# Patient Record
Sex: Female | Born: 2000 | Hispanic: Yes | Marital: Single | Smoking: Never smoker
Health system: Southern US, Community
[De-identification: ages and names within clinical notes are randomized; demographics above are authoritative.]

## PROBLEM LIST (undated history)

## (undated) DIAGNOSIS — D509 Iron deficiency anemia, unspecified: Secondary | ICD-10-CM

## (undated) DIAGNOSIS — L709 Acne, unspecified: Secondary | ICD-10-CM

## (undated) DIAGNOSIS — N920 Excessive and frequent menstruation with regular cycle: Secondary | ICD-10-CM

## (undated) HISTORY — DX: Excessive and frequent menstruation with regular cycle: N92.0

## (undated) HISTORY — PX: NO PAST SURGERIES: SHX2092

## (undated) HISTORY — DX: Iron deficiency anemia, unspecified: D50.9

## (undated) HISTORY — DX: Acne, unspecified: L70.9

---
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
## 2022-07-07 ENCOUNTER — Encounter: Payer: Self-pay | Admitting: Obstetrics and Gynecology

## 2022-08-04 NOTE — Progress Notes (Signed)
Pediatrics, [facility]   Chief Complaint  Patient presents with   Dysmenorrhea    X 3 years    HPI:      Ms. Valerie Meadows is a 22 y.o. No obstetric history on file. whose LMP was Patient's last menstrual period was 08/04/2022 (exact date)., presents today for NP eval of menometrorrhagia, ref by PCP.  Menses are monthly, lasting 3-4 days, mod to heavy flow, with 1/2 dollar sized clots, recent BTB with dysmen and headaches. Has menstrual headaches, as well as mod dysmen with periods, improved usually with tylenol and ibup but sometimes worse and pt has to miss activities. Menarche age 78; dysmen worse past few yrs. Hx of IDA 10/22 and pt taking Fe supp. States recently labs with PCP were WNL. Interested in [facility] for periods; no hx of HTN, DVTs, migraines.  Pt has been sexually active in past, not currently. Used condoms, no STD testing done.   Past Medical History:  Diagnosis Date   Acne    IDA (iron deficiency anemia)    Menorrhagia     Past Surgical History:  Procedure Laterality Date   NO PAST SURGERIES      History reviewed. No pertinent family history.  Social History   Socioeconomic History   Marital status: Single    Spouse name: Not on file   Number of children: Not on file   Years of education: Not on file   Highest education level: Not on file  Occupational History   Not on file  Tobacco Use   Smoking status: Never   Smokeless tobacco: Never  Vaping Use   Vaping Use: Never used  Substance and Sexual Activity   Alcohol use: Never   Drug use: Never   Sexual activity: Not Currently    Birth control/protection: None  Other Topics Concern   Not on file  Social History Narrative   ** Merged History Encounter **       Social Determinants of Health   Financial Resource Strain: Not on file  Food Insecurity: Not on file  Transportation Needs: Not on file  Physical Activity: Not on file  Stress: Not on file  Social Connections: Not on file   Intimate Partner Violence: Not on file    Outpatient Medications Prior to Visit  Medication Sig Dispense Refill   ferrous sulfate 325 (65 FE) MG tablet TAKE 1 TABLET BY MOUTH TWICE A DAY FOR 30 DAYS     Tretinoin Microsphere (RETIN-A MICRO PUMP) 0.06 % GEL QHS to face, wash off QAM 50 g 2   No facility-administered medications prior to visit.      ROS:  Review of Systems  Constitutional:  Negative for fever.  Gastrointestinal:  Negative for blood in stool, constipation, diarrhea, nausea and vomiting.  Genitourinary:  Positive for menstrual problem. Negative for dyspareunia, dysuria, flank pain, frequency, hematuria, urgency, vaginal bleeding, vaginal discharge and vaginal pain.  Musculoskeletal:  Negative for back pain.  Skin:  Negative for rash.   BREAST: No symptoms   OBJECTIVE:   Vitals:  BP 100/64   Ht 4\' 11"  (1.499 m)   Wt 165 lb (74.8 kg)   LMP 08/04/2022 (Exact Date)   BMI 33.33 kg/m   Physical Exam Vitals reviewed.  Constitutional:      Appearance: She is well-developed.  Pulmonary:     Effort: Pulmonary effort is normal.  Genitourinary:    General: Normal vulva.     Pubic Area: No rash.  Labia:        Right: No rash, tenderness or lesion.        Left: No rash, tenderness or lesion.      Vagina: Bleeding present. No vaginal discharge, erythema or tenderness.     Cervix: Normal.     Uterus: Normal. Not enlarged and not tender.      Adnexa: Right adnexa normal and left adnexa normal.       Right: No mass or tenderness.         Left: No mass or tenderness.    Musculoskeletal:        General: Normal range of motion.     Cervical back: Normal range of motion.  Skin:    General: Skin is warm and dry.  Neurological:     General: No focal deficit present.     Mental Status: She is alert and oriented to person, place, and time.  Psychiatric:        Mood and Affect: Mood normal.        Behavior: Behavior normal.        Thought Content: Thought  content normal.        Judgment: Judgment normal.    Assessment/Plan: Menometrorrhagia - Plan: Norethindrone Acetate-Ethinyl Estrad-FE (MICROGESTIN 24 FE) 1-20 MG-MCG(24) tablet; discussed mgmt with OCPs vs Rx NSAIDs. Pt wants to try OCPs. Start Sunday (condoms for 1 mo if going to be sexually active). F/u via phone/MyChart msg in 3 mos re: sx and OCPs/sooner prn.   Iron deficiency anemia due to chronic blood loss--pt taking Fe supp, normal now with PCP. Starting OCPs.  Encounter for initial prescription of contraceptive pills - Plan: Norethindrone Acetate-Ethinyl Estrad-FE (MICROGESTIN 24 FE) 1-20 MG-MCG(24) tablet; Rx eRxd.   Screening for STD (sexually transmitted disease) - Plan: Cervicovaginal ancillary only    Meds ordered this encounter  Medications   Norethindrone Acetate-Ethinyl Estrad-FE (MICROGESTIN 24 FE) 1-20 MG-MCG(24) tablet    Sig: Take 1 tablet by mouth daily.    Dispense:  84 tablet    Refill:  0    Order Specific Question:   Supervising Provider    Answer:   Hildred Laser [[id number]]      Return in about 1 year (around 08/05/2023) for annual.  Kimani Hovis B. Cianna Kasparian, PA-C 08/05/2022 11:22 AM

## 2022-08-05 ENCOUNTER — Encounter: Payer: Self-pay | Admitting: Obstetrics and Gynecology

## 2022-08-05 ENCOUNTER — Ambulatory Visit (INDEPENDENT_AMBULATORY_CARE_PROVIDER_SITE_OTHER): Payer: Medicaid Other | Admitting: Obstetrics and Gynecology

## 2022-08-05 ENCOUNTER — Other Ambulatory Visit (HOSPITAL_COMMUNITY)
Admission: RE | Admit: 2022-08-05 | Discharge: 2022-08-05 | Disposition: A | Discharge status: Home / Self Care | Payer: Medicaid Other | Source: Ambulatory Visit | Attending: Obstetrics and Gynecology | Admitting: Obstetrics and Gynecology

## 2022-08-05 VITALS — BP 100/64 | Ht 59.0 in | Wt 165.0 lb

## 2022-08-05 DIAGNOSIS — Z30011 Encounter for initial prescription of contraceptive pills: Secondary | ICD-10-CM | POA: Diagnosis not present

## 2022-08-05 DIAGNOSIS — Z113 Encounter for screening for infections with a predominantly sexual mode of transmission: Secondary | ICD-10-CM | POA: Insufficient documentation

## 2022-08-05 DIAGNOSIS — N921 Excessive and frequent menstruation with irregular cycle: Secondary | ICD-10-CM | POA: Diagnosis not present

## 2022-08-05 DIAGNOSIS — D500 Iron deficiency anemia secondary to blood loss (chronic): Secondary | ICD-10-CM | POA: Diagnosis not present

## 2022-08-05 MED ORDER — MICROGESTIN 24 FE 1-20 MG-MCG PO TABS: 1.0000 | ORAL_TABLET | Freq: Every day | ORAL | 0 refills | Status: AC

## 2022-08-05 NOTE — Patient Instructions (Signed)
I value your feedback and you entrusting us with your care. If you get a [facility] patient survey, I would appreciate you taking the time to let us know about your experience today. Thank you! ? ? ?

## 2022-08-10 LAB — CERVICOVAGINAL ANCILLARY ONLY
Chlamydia: NEGATIVE
Comment: NEGATIVE
Comment: NEGATIVE
Comment: NORMAL
Neisseria Gonorrhea: NEGATIVE
Trichomonas: NEGATIVE

## 2022-09-23 IMAGING — CR DG CHEST 2V
1 series · 2 of 2 positions shown · non-contrast
Comparison: None.

CLINICAL DATA: Fever and cough.

EXAM:
CHEST - 2 VIEW

[Series 1: dg chest 2 view · 0.14mm/px · 2 of 2 slices shown]
[im 1/2]
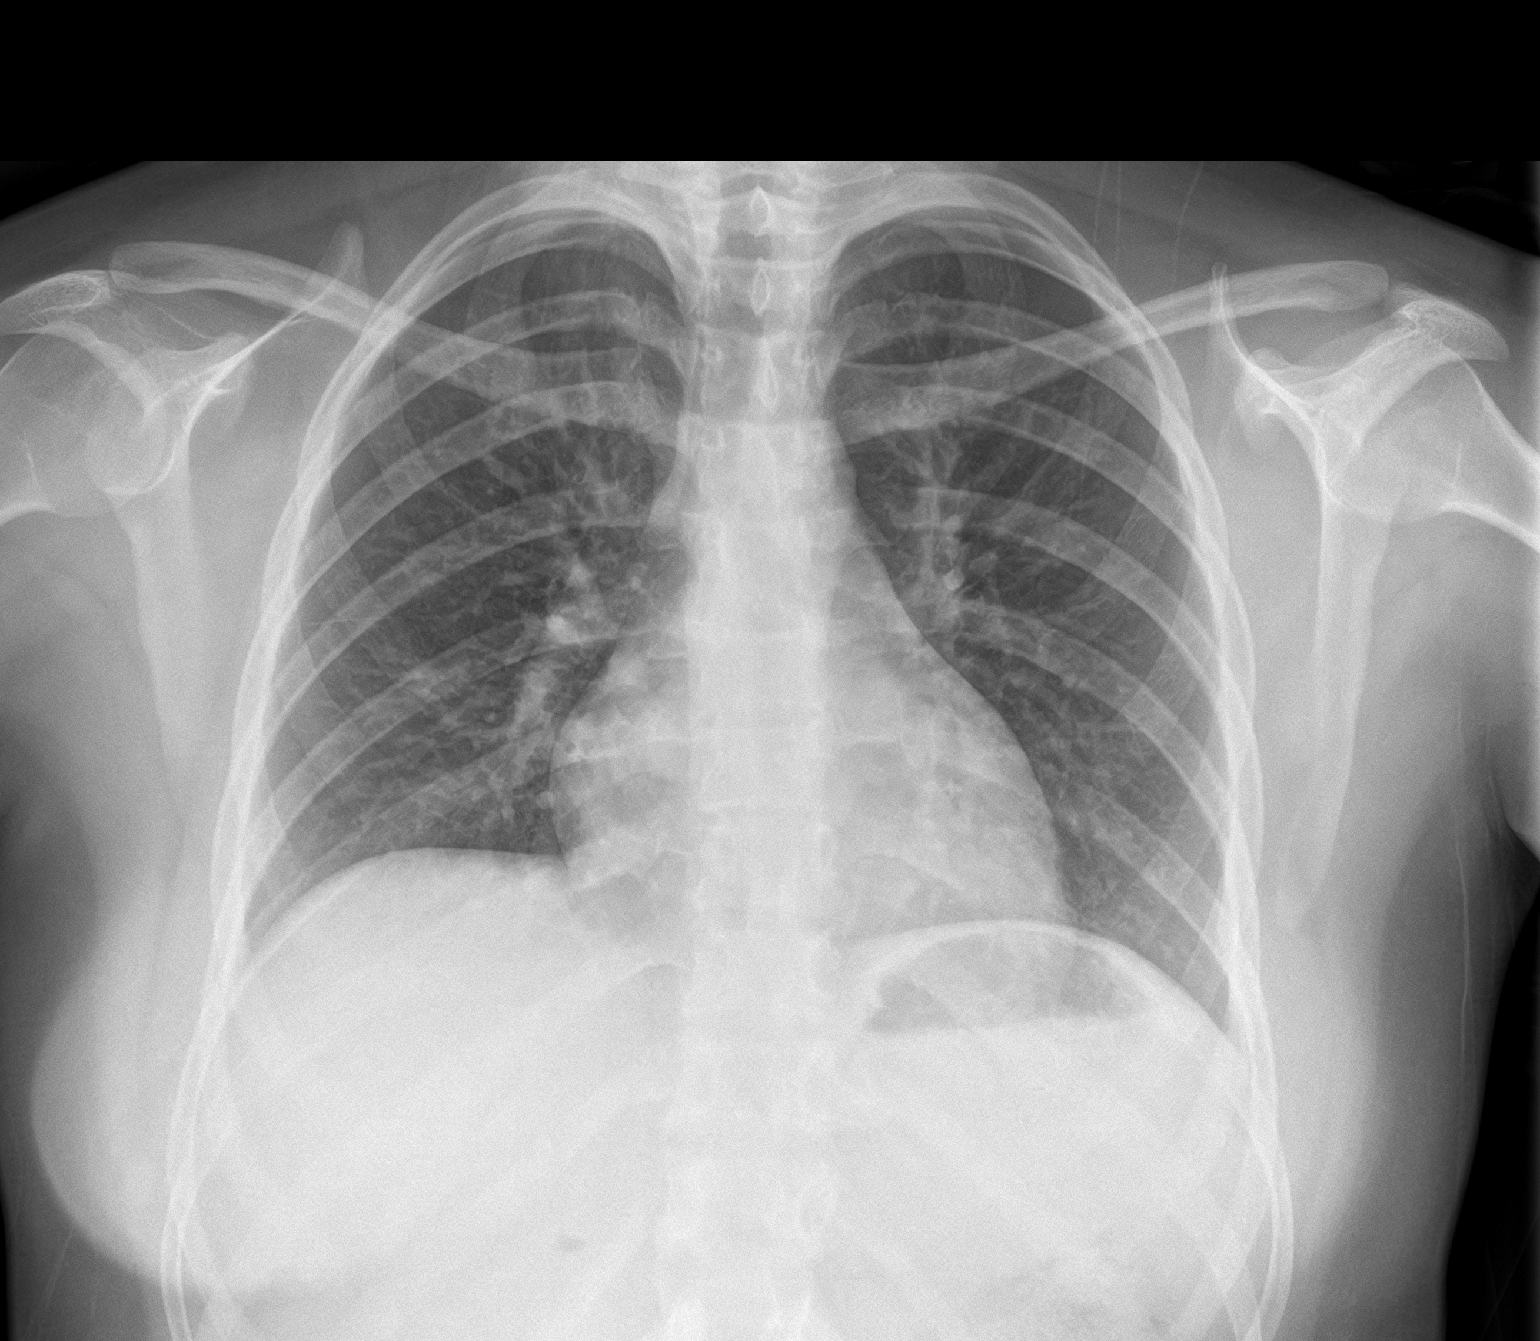
[im 2/2]
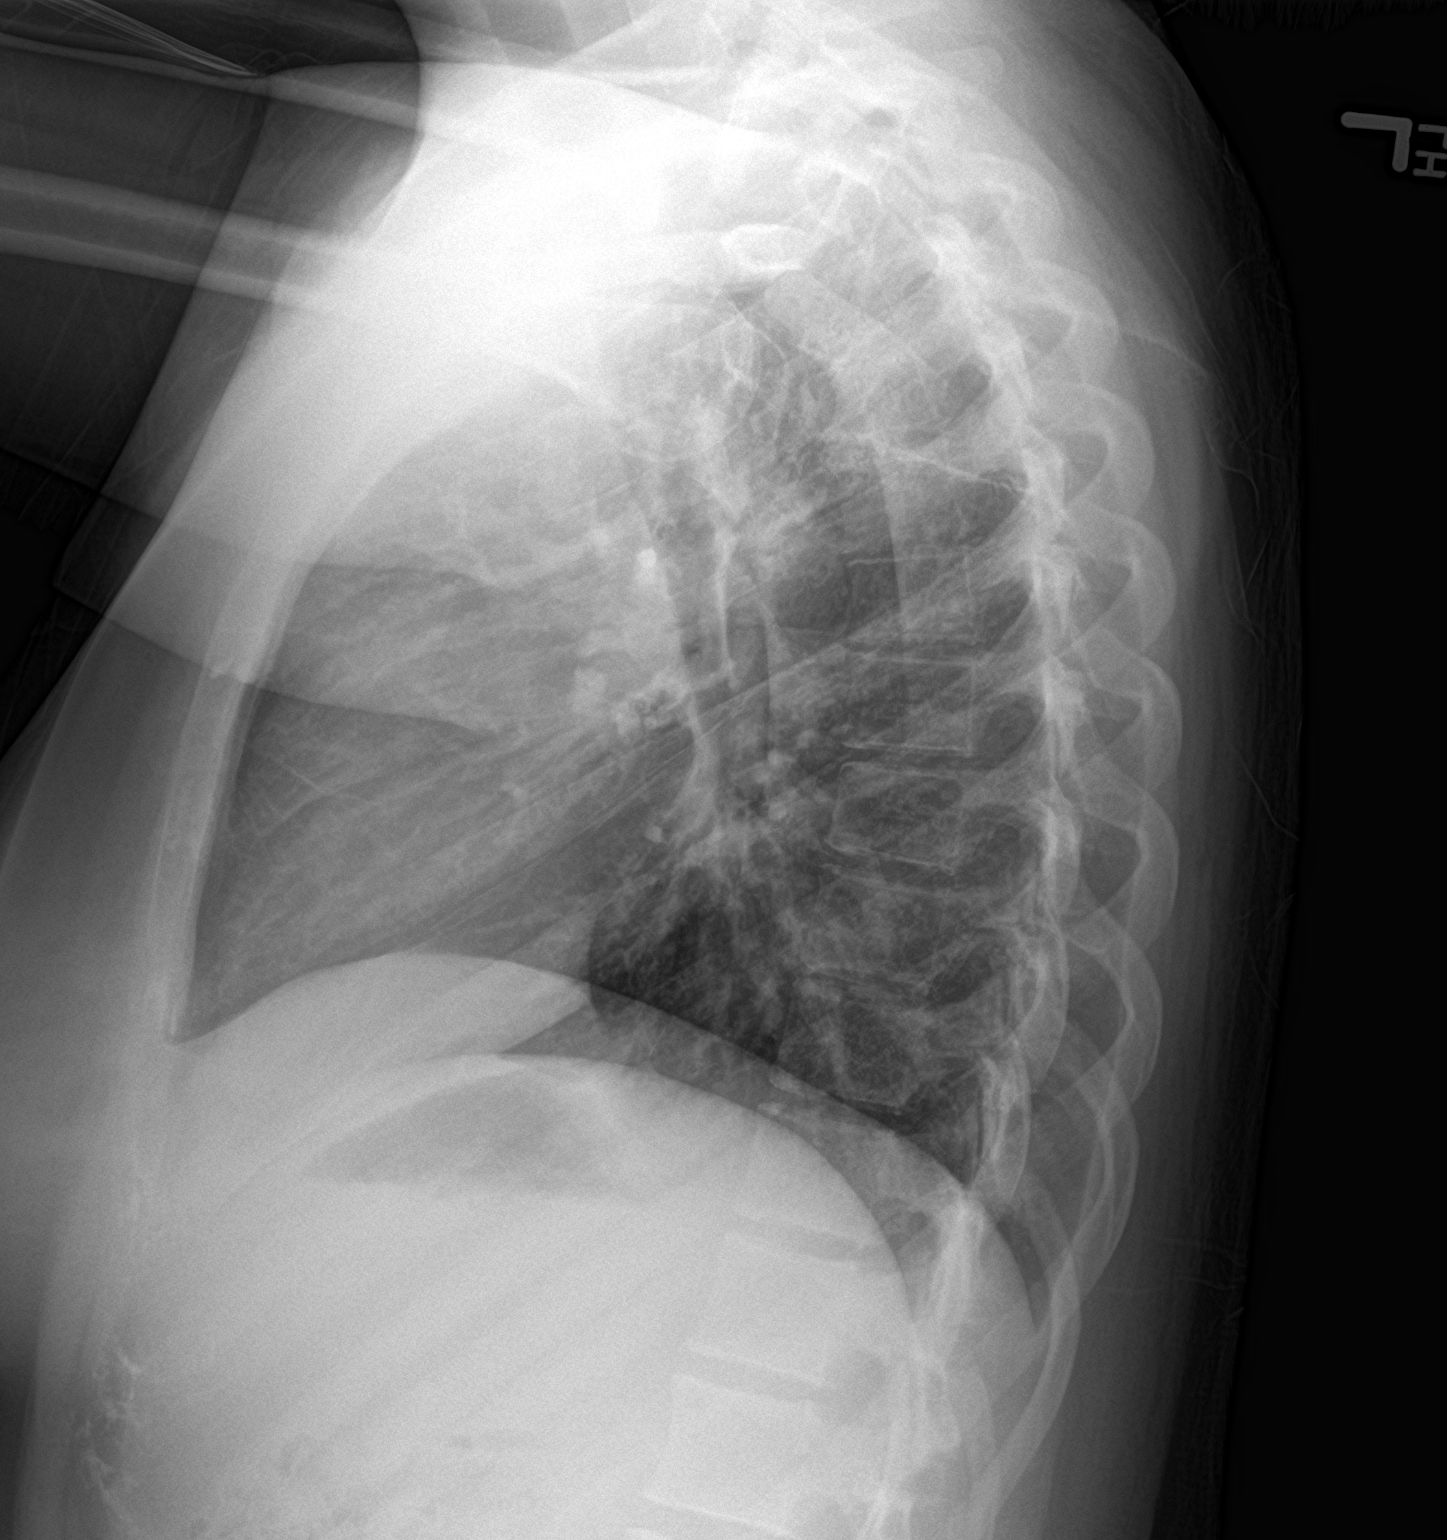

[2 of 2 positions shown; findings below may reference images not displayed]

FINDINGS: The heart size and mediastinal contours are within normal limits.
Both lungs are clear. The visualized skeletal structures are
unremarkable.
IMPRESSION: No active cardiopulmonary disease.

## 2023-04-03 ENCOUNTER — Ambulatory Visit
Admission: EM | Admit: 2023-04-03 | Discharge: 2023-04-03 | Disposition: A | Discharge status: Home / Self Care | Payer: Medicaid Other | Attending: Physician Assistant | Admitting: Physician Assistant

## 2023-04-03 ENCOUNTER — Encounter: Payer: Self-pay | Admitting: Emergency Medicine

## 2023-04-03 DIAGNOSIS — J020 Streptococcal pharyngitis: Secondary | ICD-10-CM

## 2023-04-03 DIAGNOSIS — R051 Acute cough: Secondary | ICD-10-CM | POA: Diagnosis present

## 2023-04-03 LAB — GROUP A STREP BY PCR: Group A Strep by PCR: DETECTED — AB

## 2023-04-03 MED ORDER — AMOXICILLIN 500 MG PO CAPS: 500.0000 mg | ORAL_CAPSULE | Freq: Two times a day (BID) | ORAL | 0 refills | Status: AC

## 2023-04-03 MED ORDER — PROMETHAZINE-DM 6.25-15 MG/5ML PO SYRP: 5.0000 mL | ORAL_SOLUTION | Freq: Four times a day (QID) | ORAL | 0 refills | Status: AC | PRN

## 2023-04-03 NOTE — ED Triage Notes (Signed)
Patient c/o sore throat, cough, runny nose, and chills that started over a week ago.  Patient unsure of fevers.

## 2023-04-03 NOTE — ED Provider Notes (Signed)
[facility]    CSN: [id number] Arrival date & time: 04/03/23  1301      History   Chief Complaint Chief Complaint  Patient presents with   Sore Throat   Cough    HPI Valerie Meadows is a 22 y.o. female senting for nearly 1 week history of sore throat, painful swallowing, cough, congestion, chills.  Denies fever.  Reports feeling a little short of breath.  No vomiting or diarrhea.  No sick contacts.  Taking OTC meds.  HPI  Past Medical History:  Diagnosis Date   Acne    IDA (iron deficiency anemia)    Menorrhagia     Patient Active Problem List   Diagnosis Date Noted   Menometrorrhagia 08/05/2022   Iron deficiency anemia due to chronic blood loss 08/05/2022    Past Surgical History:  Procedure Laterality Date   NO PAST SURGERIES      OB History     Gravida  0   Para  0   Term  0   Preterm  0   AB  0   Living  0      SAB  0   IAB  0   Ectopic  0   Multiple  0   Live Births  0            Home Medications    Prior to Admission medications   Medication Sig Start Date End Date Taking? Authorizing Provider  amoxicillin (AMOXIL) 500 MG capsule Take 1 capsule (500 mg total) by mouth 2 (two) times daily for 10 days. 04/03/23 04/13/23 Yes Shirlee Latch, PA-C  promethazine-dextromethorphan (PROMETHAZINE-DM) 6.25-15 MG/5ML syrup Take 5 mLs by mouth 4 (four) times daily as needed. 04/03/23  Yes Eusebio Friendly B, PA-C  ferrous sulfate 325 (65 FE) MG tablet TAKE 1 TABLET BY MOUTH TWICE A DAY FOR 30 DAYS 12/26/20   [provider]  Norethindrone Acetate-Ethinyl Estrad-FE (MICROGESTIN 24 FE) 1-20 MG-MCG(24) tablet Take 1 tablet by mouth daily. 08/05/22   Copland, Ilona Sorrel, PA-C  Tretinoin Microsphere (RETIN-A MICRO PUMP) 0.06 % GEL QHS to face, wash off QAM 01/14/21   Willeen Niece, MD    Family History History reviewed. No pertinent family history.  Social History Social History   Tobacco Use   Smoking status: Never    Smokeless tobacco: Never  Vaping Use   Vaping status: Never Used  Substance Use Topics   Alcohol use: Never   Drug use: Never     Allergies   Patient has no known allergies.   Review of Systems Review of Systems  Constitutional:  Positive for fatigue. Negative for chills, diaphoresis and fever.  HENT:  Positive for congestion, rhinorrhea and sore throat. Negative for ear pain, sinus pressure and sinus pain.   Respiratory:  Positive for cough and shortness of breath.   Cardiovascular:  Negative for chest pain.  Gastrointestinal:  Negative for abdominal pain, nausea and vomiting.  Musculoskeletal:  Negative for arthralgias and myalgias.  Skin:  Negative for rash.  Neurological:  Negative for weakness and headaches.  Hematological:  Negative for adenopathy.     Physical Exam Triage Vital Signs ED Triage Vitals  Encounter Vitals Group     BP 04/03/23 1336 113/77     Systolic BP Percentile --      Diastolic BP Percentile --      Pulse Rate 04/03/23 1336 89     Resp 04/03/23 1336 15     Temp  04/03/23 1336 98.9 F (37.2 C)     Temp Source 04/03/23 1336 Oral     SpO2 04/03/23 1336 95 %     Weight 04/03/23 1333 164 lb 6.4 oz (74.6 kg)     Height 04/03/23 1333 4\' 11"  (1.499 m)     Head Circumference --      Peak Flow --      Pain Score 04/03/23 1333 7     Pain Loc --      Pain Education --      Exclude from Growth Chart --    No data found.  Updated Vital Signs BP 113/77 (BP Location: Right Arm)   Pulse 89   Temp 98.9 F (37.2 C) (Oral)   Resp 15   Ht 4\' 11"  (1.499 m)   Wt 164 lb 6.4 oz (74.6 kg)   LMP 04/02/2023 (Exact Date)   SpO2 95%   BMI 33.20 kg/m     Physical Exam Vitals and nursing note reviewed.  Constitutional:      General: She is not in acute distress.    Appearance: Normal appearance. She is well-developed. She is ill-appearing. She is not toxic-appearing.  HENT:     Head: Normocephalic and atraumatic.     Nose: Congestion present.      Mouth/Throat:     Mouth: Mucous membranes are moist.     Pharynx: Oropharynx is clear. Posterior oropharyngeal erythema present.     Tonsils: 2+ on the right. 2+ on the left.  Eyes:     General: No scleral icterus.       Right eye: No discharge.        Left eye: No discharge.     Conjunctiva/sclera: Conjunctivae normal.  Cardiovascular:     Rate and Rhythm: Normal rate and regular rhythm.     Heart sounds: Normal heart sounds.  Pulmonary:     Effort: Pulmonary effort is normal. No respiratory distress.     Breath sounds: Normal breath sounds.  Musculoskeletal:     Cervical back: Neck supple.  Skin:    General: Skin is dry.  Neurological:     General: No focal deficit present.     Mental Status: She is alert. Mental status is at baseline.     Motor: No weakness.     Gait: Gait normal.  Psychiatric:        Mood and Affect: Mood normal.        Behavior: Behavior normal.        Thought Content: Thought content normal.      UC Treatments / Results  Labs (all labs ordered are listed, but only abnormal results are displayed) Labs Reviewed  GROUP A STREP BY PCR - Abnormal; Notable for the following components:      Result Value   Group A Strep by PCR DETECTED (*)    All other components within normal limits    EKG   Radiology No results found.  Procedures Procedures (including critical care time)  Medications Ordered in UC Medications - No data to display  Initial Impression / Assessment and Plan / UC Course  I have reviewed the triage vital signs and the nursing notes.  Pertinent labs & imaging results that were available during my care of the patient were reviewed by me and considered in my medical decision making (see chart for details).   22 year old female presenting for 1 week history of sore throat, painful swallowing, cough, ingestion, shortness of breath.  No  known fever.  Vitals are normal and stable.  She is ill-appearing but nontoxic.  On exam has  nasal congestion, erythema posterior pharynx with 2+ bilateral enlarged tonsils.  Chest clear.  Strep positive.  Reviewed results with patient.  Will treat at this time with amoxicillin and Promethazine DM.  Encouraged increased rest of fluids.  Reviewed return precautions.  Work note given.   Final Clinical Impressions(s) / UC Diagnoses   Final diagnoses:  Streptococcal sore throat  Acute cough   Discharge Instructions   None    ED Prescriptions     Medication Sig Dispense Auth. Provider   amoxicillin (AMOXIL) 500 MG capsule Take 1 capsule (500 mg total) by mouth 2 (two) times daily for 10 days. 20 capsule Shirlee Latch, PA-C   promethazine-dextromethorphan (PROMETHAZINE-DM) 6.25-15 MG/5ML syrup Take 5 mLs by mouth 4 (four) times daily as needed. 118 mL Shirlee Latch, PA-C      PDMP not reviewed this encounter.   Shirlee Latch, PA-C 04/03/23 1409
